# Patient Record
Sex: Male | Born: 1970 | Race: Black or African American | Hispanic: No | Marital: Single | Smoking: Never smoker
Health system: Southern US, Community
[De-identification: ages and names within clinical notes are randomized; demographics above are authoritative.]

## PROBLEM LIST (undated history)

## (undated) HISTORY — PX: SHOULDER SURGERY: SHX246

---

## 2010-10-15 ENCOUNTER — Emergency Department (HOSPITAL_BASED_OUTPATIENT_CLINIC_OR_DEPARTMENT_OTHER)
Admission: EM | Admit: 2010-10-15 | Discharge: 2010-10-15 | Disposition: A | Discharge status: Home / Self Care | Payer: Self-pay | Attending: Emergency Medicine | Admitting: Emergency Medicine

## 2010-10-15 DIAGNOSIS — G510 Bell's palsy: Secondary | ICD-10-CM | POA: Insufficient documentation

## 2010-10-15 DIAGNOSIS — R29810 Facial weakness: Secondary | ICD-10-CM | POA: Insufficient documentation

## 2012-04-22 ENCOUNTER — Emergency Department (HOSPITAL_BASED_OUTPATIENT_CLINIC_OR_DEPARTMENT_OTHER)
Admission: EM | Admit: 2012-04-22 | Discharge: 2012-04-22 | Disposition: A | Discharge status: Home / Self Care | Payer: Self-pay | Attending: Emergency Medicine | Admitting: Emergency Medicine

## 2012-04-22 ENCOUNTER — Emergency Department (HOSPITAL_BASED_OUTPATIENT_CLINIC_OR_DEPARTMENT_OTHER): Payer: Self-pay

## 2012-04-22 ENCOUNTER — Encounter (HOSPITAL_BASED_OUTPATIENT_CLINIC_OR_DEPARTMENT_OTHER): Payer: Self-pay | Admitting: *Deleted

## 2012-04-22 DIAGNOSIS — S59909A Unspecified injury of unspecified elbow, initial encounter: Secondary | ICD-10-CM | POA: Insufficient documentation

## 2012-04-22 DIAGNOSIS — Y9389 Activity, other specified: Secondary | ICD-10-CM | POA: Insufficient documentation

## 2012-04-22 DIAGNOSIS — S79929A Unspecified injury of unspecified thigh, initial encounter: Secondary | ICD-10-CM | POA: Insufficient documentation

## 2012-04-22 DIAGNOSIS — S0990XA Unspecified injury of head, initial encounter: Secondary | ICD-10-CM | POA: Insufficient documentation

## 2012-04-22 DIAGNOSIS — M25559 Pain in unspecified hip: Secondary | ICD-10-CM

## 2012-04-22 DIAGNOSIS — W010XXA Fall on same level from slipping, tripping and stumbling without subsequent striking against object, initial encounter: Secondary | ICD-10-CM | POA: Insufficient documentation

## 2012-04-22 DIAGNOSIS — W19XXXA Unspecified fall, initial encounter: Secondary | ICD-10-CM

## 2012-04-22 DIAGNOSIS — IMO0002 Reserved for concepts with insufficient information to code with codable children: Secondary | ICD-10-CM | POA: Insufficient documentation

## 2012-04-22 DIAGNOSIS — S59919A Unspecified injury of unspecified forearm, initial encounter: Secondary | ICD-10-CM | POA: Insufficient documentation

## 2012-04-22 DIAGNOSIS — Y929 Unspecified place or not applicable: Secondary | ICD-10-CM | POA: Insufficient documentation

## 2012-04-22 DIAGNOSIS — S6990XA Unspecified injury of unspecified wrist, hand and finger(s), initial encounter: Secondary | ICD-10-CM | POA: Insufficient documentation

## 2012-04-22 DIAGNOSIS — M545 Low back pain: Secondary | ICD-10-CM

## 2012-04-22 DIAGNOSIS — M79639 Pain in unspecified forearm: Secondary | ICD-10-CM

## 2012-04-22 DIAGNOSIS — S79919A Unspecified injury of unspecified hip, initial encounter: Secondary | ICD-10-CM | POA: Insufficient documentation

## 2012-04-22 MED ORDER — IBUPROFEN 800 MG PO TABS
800.0000 mg | ORAL_TABLET | Freq: Once | ORAL | Status: AC
  Administered 2012-04-22: 800 mg via ORAL
  Filled 2012-04-22: qty 1

## 2012-04-22 MED ORDER — HYDROCODONE-ACETAMINOPHEN 5-325 MG PO TABS: 1.0000 | ORAL_TABLET | Freq: Four times a day (QID) | ORAL | Status: DC | PRN

## 2012-04-22 MED ORDER — IBUPROFEN 600 MG PO TABS: 600.0000 mg | ORAL_TABLET | Freq: Four times a day (QID) | ORAL | Status: DC | PRN

## 2012-04-22 MED ORDER — OXYCODONE-ACETAMINOPHEN 5-325 MG PO TABS
1.0000 | ORAL_TABLET | Freq: Once | ORAL | Status: DC
  Filled 2012-04-22 (×2): qty 1

## 2012-04-22 MED ORDER — TETANUS-DIPHTH-ACELL PERTUSSIS 5-2.5-18.5 LF-MCG/0.5 IM SUSP: 0.5000 mL | Freq: Once | INTRAMUSCULAR | Status: DC

## 2012-04-22 NOTE — ED Notes (Signed)
Slipped on ice and fell onto concrete surface. Injury to the left side of his body, back of his head and lower back.
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
## 2012-04-29 ENCOUNTER — Emergency Department (HOSPITAL_BASED_OUTPATIENT_CLINIC_OR_DEPARTMENT_OTHER)
Admission: EM | Admit: 2012-04-29 | Discharge: 2012-04-29 | Disposition: A | Discharge status: Home / Self Care | Payer: Self-pay | Attending: Emergency Medicine | Admitting: Emergency Medicine

## 2012-04-29 ENCOUNTER — Emergency Department (HOSPITAL_BASED_OUTPATIENT_CLINIC_OR_DEPARTMENT_OTHER): Payer: Self-pay

## 2012-04-29 ENCOUNTER — Encounter (HOSPITAL_BASED_OUTPATIENT_CLINIC_OR_DEPARTMENT_OTHER): Payer: Self-pay | Admitting: Emergency Medicine

## 2012-04-29 DIAGNOSIS — S4980XA Other specified injuries of shoulder and upper arm, unspecified arm, initial encounter: Secondary | ICD-10-CM | POA: Insufficient documentation

## 2012-04-29 DIAGNOSIS — Y939 Activity, unspecified: Secondary | ICD-10-CM | POA: Insufficient documentation

## 2012-04-29 DIAGNOSIS — M25519 Pain in unspecified shoulder: Secondary | ICD-10-CM

## 2012-04-29 DIAGNOSIS — S46909A Unspecified injury of unspecified muscle, fascia and tendon at shoulder and upper arm level, unspecified arm, initial encounter: Secondary | ICD-10-CM | POA: Insufficient documentation

## 2012-04-29 DIAGNOSIS — W108XXA Fall (on) (from) other stairs and steps, initial encounter: Secondary | ICD-10-CM | POA: Insufficient documentation

## 2012-04-29 DIAGNOSIS — Y929 Unspecified place or not applicable: Secondary | ICD-10-CM | POA: Insufficient documentation

## 2012-04-29 NOTE — ED Notes (Signed)
Pt fell down flight of stairs last week, pt evaluated here, was seen by chiropractor today and referred her for further evaluation

## 2012-04-29 NOTE — ED Provider Notes (Signed)
History     CSN: [id number]  Arrival date & time 04/29/12  2058   First MD Initiated Contact with Patient 04/29/12 2115      Chief Complaint  Patient presents with  . Shoulder Pain    (Consider location/radiation/quality/duration/timing/severity/associated sxs/prior treatment) HPI Comments: Pt states that he fell down a flight of stairs a weeks ago and was seen here and now the right shoulder is hurting and it feels like when the rotator cuff on the other side:pt denies numbness or weakness  Patient is a 42 y.o. male presenting with shoulder pain. The history is provided by the patient. No language interpreter was used.  Shoulder Pain This is a new problem. The current episode started in the past 7 days. The problem occurs constantly. The problem has been unchanged. Pertinent negatives include no neck pain. The symptoms are aggravated by twisting. He has tried nothing for the symptoms.    History reviewed. No pertinent past medical history.  Past Surgical History  Procedure Date  . Shoulder surgery     History reviewed. No pertinent family history.  History  Substance Use Topics  . Smoking status: Never Smoker   . Smokeless tobacco: Not on file  . Alcohol Use: No      Review of Systems  HENT: Negative for neck pain.   Respiratory: Negative.   Cardiovascular: Negative.     Allergies  Review of patient's allergies indicates no known allergies.  Home Medications   Current Outpatient Rx  Name  Route  Sig  Dispense  Refill  . HYDROCODONE-ACETAMINOPHEN 5-325 MG PO TABS   Oral   Take 1 tablet by mouth every 6 (six) hours as needed for pain.   15 tablet   0   . IBUPROFEN 600 MG PO TABS   Oral   Take 1 tablet (600 mg total) by mouth every 6 (six) hours as needed for pain.   30 tablet   0   . IBUPROFEN 600 MG PO TABS   Oral   Take 1 tablet (600 mg total) by mouth every 6 (six) hours as needed for pain.   30 tablet   0     BP 118/73  Pulse 69  Temp 98.6  F (37 C) (Oral)  Resp 18  SpO2 96%  Physical Exam  Nursing note and vitals reviewed. Constitutional: He is oriented to person, place, and time. He appears well-developed and well-nourished.  Cardiovascular: Normal rate and regular rhythm.   Pulmonary/Chest: Effort normal and breath sounds normal.  Musculoskeletal: Normal range of motion.       Pt tender in the anterior right shoulder:pt has full rom  Neurological: He is alert and oriented to person, place, and time.  Skin: Skin is dry.    ED Course  Procedures (including critical care time)  Labs Reviewed - No data to display Dg Shoulder Right  04/29/2012  *RADIOLOGY REPORT*  Clinical Data: Pain post fall.  RIGHT SHOULDER - 2+ VIEW  Comparison: None.  Findings: Negative for fracture, dislocation, or other acute abnormality.  Normal alignment and mineralization. No significant degenerative change.  Regional soft tissues unremarkable.  IMPRESSION:  Negative   Original Report Authenticated By: D. Andria Rhein, MD      1. Shoulder pain       MDM  No acute injury noted;pt given sports medicine follow up        Teressa Lower, NP 04/29/12 2151

## 2012-04-30 NOTE — ED Provider Notes (Signed)
Medical screening examination/treatment/procedure(s) were performed by non-physician practitioner and as supervising physician I was immediately available for consultation/collaboration.   Carleene Cooper III, MD 04/30/12 [phone]

## 2012-05-07 ENCOUNTER — Encounter: Payer: Self-pay | Admitting: Family Medicine

## 2012-05-07 ENCOUNTER — Ambulatory Visit (INDEPENDENT_AMBULATORY_CARE_PROVIDER_SITE_OTHER): Payer: Self-pay | Admitting: Family Medicine

## 2012-05-07 VITALS — BP 119/80 | HR 74 | Ht 72.0 in | Wt 194.0 lb

## 2012-05-07 DIAGNOSIS — S46011A Strain of muscle(s) and tendon(s) of the rotator cuff of right shoulder, initial encounter: Secondary | ICD-10-CM

## 2012-05-07 DIAGNOSIS — S4991XA Unspecified injury of right shoulder and upper arm, initial encounter: Secondary | ICD-10-CM

## 2012-05-07 DIAGNOSIS — S43429A Sprain of unspecified rotator cuff capsule, initial encounter: Secondary | ICD-10-CM

## 2012-05-07 DIAGNOSIS — S46909A Unspecified injury of unspecified muscle, fascia and tendon at shoulder and upper arm level, unspecified arm, initial encounter: Secondary | ICD-10-CM

## 2012-05-07 DIAGNOSIS — S4980XA Other specified injuries of shoulder and upper arm, unspecified arm, initial encounter: Secondary | ICD-10-CM

## 2012-05-07 MED ORDER — INDOMETHACIN 50 MG PO CAPS: 50.0000 mg | ORAL_CAPSULE | Freq: Two times a day (BID) | ORAL | Status: AC

## 2012-05-07 NOTE — Patient Instructions (Addendum)
You have strained your right rotator cuff. Try to avoid painful activities (overhead activities, lifting with extended arm) as much as possible. Tylenol as needed for pain. Indomethacin twice a day with food for pain and inflammation. Start physical therapy with transition to home exercise program. Subacromial injection may be beneficial to help with pain and to decrease inflammation - would only consider if you weren't getting better with the therapy and indomethacin after a month. Do home exercise program on days you don't go to therapy. If not improving at follow-up we will MRI. Follow up with me in 1 month to 6 weeks.

## 2012-05-09 ENCOUNTER — Encounter: Payer: Self-pay | Admitting: Family Medicine

## 2012-05-09 DIAGNOSIS — S4991XA Unspecified injury of right shoulder and upper arm, initial encounter: Secondary | ICD-10-CM | POA: Insufficient documentation

## 2012-05-09 NOTE — Progress Notes (Signed)
  Subjective:    Patient ID: Brandon Salinas, male    DOB: 1970/06/17, 42 y.o.   MRN: [id number]  PCP: None  HPI 42 yo M here for right shoulder injury.  Patient reports he was at an establishment ([employer]) on 1/7 when he fell down a large flight of stairs (down about 18 steps). He sustained multiple injuries from this and went to ED - radiographs were all negative. He states most of those have improved but over the course of a week from that right shoulder started worsening. He tried to grab things on the way down and thinks this is how he hurt this. Worse with reaching, overhead, pushing down motions. Feels deep lateral in right shoulder. No numbness or tingling. No neck pain. Had rotator cuff debridement, arthroscopy on left shoulder but no right shoulder surgeries or injuries in past.  History reviewed. No pertinent past medical history.  No current outpatient prescriptions on file prior to visit.    Past Surgical History  Procedure Date  . Shoulder surgery     arthroscopy left shoulder    No Known Allergies  History   Social History  . Marital Status: Single    Spouse Name: N/A    Number of Children: N/A  . Years of Education: N/A   Occupational History  . Not on file.   Social History Main Topics  . Smoking status: Never Smoker   . Smokeless tobacco: Not on file  . Alcohol Use: No  . Drug Use: No  . Sexually Active: Not on file   Other Topics Concern  . Not on file   Social History Narrative  . No narrative on file    Family History  Problem Relation Age of Onset  . Hypertension Mother   . Diabetes Neg Hx   . Heart attack Neg Hx   . Hyperlipidemia Neg Hx   . Sudden death Neg Hx     BP 119/80  Pulse 74  Ht 6' (1.829 m)  Wt 194 lb (87.998 kg)  BMI 26.31 kg/m2  Review of Systems See HPI above.    Objective:   Physical Exam Gen: NAD  R shoulder: No swelling, ecchymoses.  No gross deformity. No TTP AC joint, biceps  tendon. FROM with mild painful arc. Positive Hawkins, Neers. Negative Speeds, Yergasons. Strength 5/5 with empty can and resisted internal/external rotation.  Mild pain with empty can. Negative apprehension. NV intact distally. Negative sulcus. Negative o'briens.  L shoulder: FROM without pain or weakness.    Assessment & Plan:  1. Right shoulder injury - excellent strength - consistent with right rotator cuff strain following his fall.  Start with physical therapy and home exercises.  Did well with indomethacin before - will start this with food twice a day.  Consider MRI, injection if not improving as expected over the next month.  Avoid painful activities.  See instructions for further.

## 2012-05-09 NOTE — Assessment & Plan Note (Signed)
excellent strength - consistent with right rotator cuff strain following his fall.  Start with physical therapy and home exercises.  Did well with indomethacin before - will start this with food twice a day.  Consider MRI, injection if not improving as expected over the next month.  Avoid painful activities.  See instructions for further.

## 2012-05-12 ENCOUNTER — Ambulatory Visit: Payer: Self-pay | Attending: Family Medicine | Admitting: Physical Therapy

## 2012-05-12 DIAGNOSIS — IMO0001 Reserved for inherently not codable concepts without codable children: Secondary | ICD-10-CM | POA: Insufficient documentation

## 2012-05-12 DIAGNOSIS — M25619 Stiffness of unspecified shoulder, not elsewhere classified: Secondary | ICD-10-CM | POA: Insufficient documentation

## 2012-05-12 DIAGNOSIS — M25519 Pain in unspecified shoulder: Secondary | ICD-10-CM | POA: Insufficient documentation

## 2012-05-15 ENCOUNTER — Ambulatory Visit: Payer: Self-pay | Admitting: Physical Therapy

## 2012-05-16 ENCOUNTER — Ambulatory Visit: Payer: Self-pay | Admitting: Physical Therapy

## 2012-05-21 ENCOUNTER — Ambulatory Visit: Payer: Self-pay | Attending: Family Medicine | Admitting: Physical Therapy

## 2012-05-21 DIAGNOSIS — M25619 Stiffness of unspecified shoulder, not elsewhere classified: Secondary | ICD-10-CM | POA: Insufficient documentation

## 2012-05-21 DIAGNOSIS — IMO0001 Reserved for inherently not codable concepts without codable children: Secondary | ICD-10-CM | POA: Insufficient documentation

## 2012-05-21 DIAGNOSIS — M25519 Pain in unspecified shoulder: Secondary | ICD-10-CM | POA: Insufficient documentation

## 2012-05-27 ENCOUNTER — Ambulatory Visit: Payer: Self-pay | Admitting: Physical Therapy

## 2012-06-03 ENCOUNTER — Ambulatory Visit: Payer: Self-pay | Admitting: Physical Therapy

## 2012-06-11 ENCOUNTER — Ambulatory Visit: Payer: Self-pay | Admitting: Family Medicine

## 2017-07-26 ENCOUNTER — Emergency Department (HOSPITAL_BASED_OUTPATIENT_CLINIC_OR_DEPARTMENT_OTHER): Payer: No Typology Code available for payment source

## 2017-07-26 ENCOUNTER — Emergency Department (HOSPITAL_BASED_OUTPATIENT_CLINIC_OR_DEPARTMENT_OTHER)
Admission: EM | Admit: 2017-07-26 | Discharge: 2017-07-26 | Disposition: A | Discharge status: Home / Self Care | Payer: No Typology Code available for payment source | Attending: Emergency Medicine | Admitting: Emergency Medicine

## 2017-07-26 ENCOUNTER — Other Ambulatory Visit: Payer: Self-pay

## 2017-07-26 ENCOUNTER — Encounter (HOSPITAL_BASED_OUTPATIENT_CLINIC_OR_DEPARTMENT_OTHER): Payer: Self-pay

## 2017-07-26 DIAGNOSIS — R109 Unspecified abdominal pain: Secondary | ICD-10-CM

## 2017-07-26 DIAGNOSIS — Y939 Activity, unspecified: Secondary | ICD-10-CM | POA: Diagnosis not present

## 2017-07-26 DIAGNOSIS — R1031 Right lower quadrant pain: Secondary | ICD-10-CM | POA: Diagnosis present

## 2017-07-26 DIAGNOSIS — Y999 Unspecified external cause status: Secondary | ICD-10-CM | POA: Diagnosis not present

## 2017-07-26 DIAGNOSIS — Y929 Unspecified place or not applicable: Secondary | ICD-10-CM | POA: Diagnosis not present

## 2017-07-26 LAB — COMPREHENSIVE METABOLIC PANEL
ALT: 42 U/L (ref 17–63)
AST: 34 U/L (ref 15–41)
Albumin: 4.3 g/dL (ref 3.5–5.0)
Alkaline Phosphatase: 52 U/L (ref 38–126)
Anion gap: 9 (ref 5–15)
BILIRUBIN TOTAL: 0.6 mg/dL (ref 0.3–1.2)
BUN: 15 mg/dL (ref 6–20)
CO2: 24 mmol/L (ref 22–32)
Calcium: 9 mg/dL (ref 8.9–10.3)
Chloride: 104 mmol/L (ref 101–111)
Creatinine, Ser: 1.37 mg/dL — ABNORMAL HIGH (ref 0.61–1.24)
GFR calc Af Amer: >60 mL/min (ref >60)
Glucose, Bld: 90 mg/dL (ref 65–99)
POTASSIUM: 3.8 mmol/L (ref 3.5–5.1)
Sodium: 137 mmol/L (ref 135–145)
TOTAL PROTEIN: 7.3 g/dL (ref 6.5–8.1)

## 2017-07-26 LAB — URINALYSIS, MICROSCOPIC (REFLEX)

## 2017-07-26 LAB — CBC WITH DIFFERENTIAL/PLATELET
BASOS ABS: 0 10*3/uL (ref 0.0–0.1)
Basophils Relative: 1 %
EOS ABS: 0.2 10*3/uL (ref 0.0–0.7)
EOS PCT: 4 %
HCT: 44 % (ref 39.0–52.0)
Hemoglobin: 15.5 g/dL (ref 13.0–17.0)
LYMPHS PCT: 43 %
Lymphs Abs: 2 10*3/uL (ref 0.7–4.0)
MCH: 30.9 pg (ref 26.0–34.0)
MCHC: 35.2 g/dL (ref 30.0–36.0)
MCV: 87.8 fL (ref 78.0–100.0)
Monocytes Absolute: 0.4 10*3/uL (ref 0.1–1.0)
Monocytes Relative: 9 %
NEUTROS PCT: 43 %
Neutro Abs: 2 10*3/uL (ref 1.7–7.7)
PLATELETS: 164 10*3/uL (ref 150–400)
RBC: 5.01 MIL/uL (ref 4.22–5.81)
RDW: 12.3 % (ref 11.5–15.5)
WBC: 4.6 10*3/uL (ref 4.0–10.5)

## 2017-07-26 LAB — URINALYSIS, ROUTINE W REFLEX MICROSCOPIC
Bilirubin Urine: NEGATIVE
GLUCOSE, UA: NEGATIVE mg/dL
KETONES UR: NEGATIVE mg/dL
LEUKOCYTES UA: NEGATIVE
NITRITE: NEGATIVE
PROTEIN: NEGATIVE mg/dL
Specific Gravity, Urine: 1.025 (ref 1.005–1.030)
pH: 6 (ref 5.0–8.0)

## 2017-07-26 MED ORDER — KETOROLAC TROMETHAMINE 30 MG/ML IJ SOLN
30.0000 mg | Freq: Once | INTRAMUSCULAR | Status: AC
  Administered 2017-07-26: 30 mg via INTRAVENOUS
  Filled 2017-07-26: qty 1

## 2017-07-26 MED ORDER — IOPAMIDOL (ISOVUE-300) INJECTION 61%
100.0000 mL | Freq: Once | INTRAVENOUS | Status: AC | PRN
  Administered 2017-07-26: 100 mL via INTRAVENOUS

## 2017-07-26 MED ORDER — CYCLOBENZAPRINE HCL 5 MG PO TABS
5.0000 mg | ORAL_TABLET | Freq: Once | ORAL | Status: AC
  Administered 2017-07-26: 5 mg via ORAL
  Filled 2017-07-26: qty 1

## 2017-07-26 MED ORDER — CYCLOBENZAPRINE HCL 7.5 MG PO TABS: 7.5000 mg | ORAL_TABLET | Freq: Three times a day (TID) | ORAL | 0 refills | Status: DC | PRN
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
## 2017-07-26 NOTE — ED Notes (Signed)
Patient transported to X-ray 

## 2017-07-26 NOTE — ED Notes (Signed)
Pt verbalizes understanding of d/c instructions and denies any further needs at this time. 

## 2017-07-26 NOTE — Discharge Instructions (Addendum)
For pain, you can take ibuprofen or Tylenol 1-2 tablets every 6-8 hours over the next 3-5 days to help with inflammation and pain. You can also take Flexeril up to every 8 hours as needed for muscle spasms or pain. Try to stay moving as much as possible so your muscles will not get tight.   Please establish care with a primary doctor. The following two practices are accepting practices: [facility] [phone] [facility] [phone] It is important to schedule an appointment to make sure the blood in your urine has gone away.   If you begin to feel worse, or if you start having more vomiting or a severe headache, please return to the emergency room.

## 2017-07-26 NOTE — ED Notes (Signed)
ED Provider at bedside. 
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
## 2022-08-21 ENCOUNTER — Encounter (HOSPITAL_BASED_OUTPATIENT_CLINIC_OR_DEPARTMENT_OTHER): Payer: Self-pay | Admitting: Urology

## 2022-08-21 ENCOUNTER — Emergency Department (HOSPITAL_BASED_OUTPATIENT_CLINIC_OR_DEPARTMENT_OTHER)
Admission: EM | Admit: 2022-08-21 | Discharge: 2022-08-21 | Disposition: A | Discharge status: Home / Self Care | Payer: No Typology Code available for payment source | Attending: Emergency Medicine | Admitting: Emergency Medicine

## 2022-08-21 ENCOUNTER — Other Ambulatory Visit: Payer: Self-pay

## 2022-08-21 DIAGNOSIS — M542 Cervicalgia: Secondary | ICD-10-CM | POA: Diagnosis present

## 2022-08-21 DIAGNOSIS — S161XXA Strain of muscle, fascia and tendon at neck level, initial encounter: Secondary | ICD-10-CM | POA: Diagnosis not present

## 2022-08-21 DIAGNOSIS — Y92410 Unspecified street and highway as the place of occurrence of the external cause: Secondary | ICD-10-CM | POA: Diagnosis not present

## 2022-08-21 MED ORDER — CYCLOBENZAPRINE HCL 10 MG PO TABS: 10.0000 mg | ORAL_TABLET | Freq: Two times a day (BID) | ORAL | 0 refills | Status: AC | PRN
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
## 2022-08-21 NOTE — Discharge Instructions (Addendum)
You can continue to take Tylenol as needed or ibuprofen for the pain.  Hot shower muscle rubs may also be helpful.  If it still hurting in over a week a massage might be helpful.
